# Patient Record
Sex: Male | Born: 1965 | Race: White | Hispanic: No
Health system: Southern US, Community
[De-identification: ages and names within clinical notes are randomized; demographics above are authoritative.]

---

## 1999-07-18 ENCOUNTER — Encounter: Payer: Self-pay | Admitting: Neurological Surgery

## 1999-07-18 ENCOUNTER — Inpatient Hospital Stay (HOSPITAL_COMMUNITY): Admission: AD | Admit: 1999-07-18 | Discharge: 1999-07-19 | Payer: Self-pay | Admitting: Neurological Surgery

## 2002-03-29 ENCOUNTER — Encounter: Payer: Self-pay | Admitting: Emergency Medicine

## 2002-03-29 ENCOUNTER — Encounter: Payer: Self-pay | Admitting: Neurosurgery

## 2002-03-29 ENCOUNTER — Inpatient Hospital Stay (HOSPITAL_COMMUNITY): Admission: EM | Admit: 2002-03-29 | Discharge: 2002-03-31 | Payer: Self-pay | Admitting: Emergency Medicine

## 2002-03-30 ENCOUNTER — Encounter: Payer: Self-pay | Admitting: Emergency Medicine

## 2002-03-30 ENCOUNTER — Encounter: Payer: Self-pay | Admitting: Neurological Surgery

## 2002-07-09 ENCOUNTER — Ambulatory Visit (HOSPITAL_COMMUNITY): Admission: RE | Admit: 2002-07-09 | Discharge: 2002-07-09 | Payer: Self-pay | Admitting: Neurological Surgery

## 2002-07-09 ENCOUNTER — Encounter: Payer: Self-pay | Admitting: Neurological Surgery

## 2003-04-05 ENCOUNTER — Ambulatory Visit (HOSPITAL_COMMUNITY): Admission: RE | Admit: 2003-04-05 | Discharge: 2003-04-05 | Payer: Self-pay | Admitting: Neurological Surgery

## 2003-04-05 ENCOUNTER — Encounter: Payer: Self-pay | Admitting: Neurological Surgery

## 2012-02-05 DIAGNOSIS — IMO0002 Reserved for concepts with insufficient information to code with codable children: Secondary | ICD-10-CM | POA: Diagnosis not present

## 2012-02-21 DIAGNOSIS — M47817 Spondylosis without myelopathy or radiculopathy, lumbosacral region: Secondary | ICD-10-CM | POA: Diagnosis not present

## 2012-03-06 DIAGNOSIS — M47817 Spondylosis without myelopathy or radiculopathy, lumbosacral region: Secondary | ICD-10-CM | POA: Diagnosis not present

## 2012-03-27 DIAGNOSIS — M47817 Spondylosis without myelopathy or radiculopathy, lumbosacral region: Secondary | ICD-10-CM | POA: Diagnosis not present

## 2012-04-20 DIAGNOSIS — R918 Other nonspecific abnormal finding of lung field: Secondary | ICD-10-CM | POA: Diagnosis not present

## 2012-04-20 DIAGNOSIS — R0789 Other chest pain: Secondary | ICD-10-CM | POA: Diagnosis not present

## 2012-04-20 DIAGNOSIS — F17200 Nicotine dependence, unspecified, uncomplicated: Secondary | ICD-10-CM | POA: Diagnosis not present

## 2012-04-20 DIAGNOSIS — R509 Fever, unspecified: Secondary | ICD-10-CM | POA: Diagnosis not present

## 2012-04-20 DIAGNOSIS — R0602 Shortness of breath: Secondary | ICD-10-CM | POA: Diagnosis not present

## 2012-05-01 DIAGNOSIS — M545 Low back pain, unspecified: Secondary | ICD-10-CM | POA: Diagnosis not present

## 2012-05-01 DIAGNOSIS — M5137 Other intervertebral disc degeneration, lumbosacral region: Secondary | ICD-10-CM | POA: Diagnosis not present

## 2012-05-09 DIAGNOSIS — M5137 Other intervertebral disc degeneration, lumbosacral region: Secondary | ICD-10-CM | POA: Diagnosis not present

## 2012-08-04 DIAGNOSIS — M545 Low back pain, unspecified: Secondary | ICD-10-CM | POA: Diagnosis not present

## 2012-10-21 DIAGNOSIS — M545 Low back pain, unspecified: Secondary | ICD-10-CM | POA: Diagnosis not present

## 2012-10-21 DIAGNOSIS — IMO0002 Reserved for concepts with insufficient information to code with codable children: Secondary | ICD-10-CM | POA: Diagnosis not present

## 2012-10-21 DIAGNOSIS — M48061 Spinal stenosis, lumbar region without neurogenic claudication: Secondary | ICD-10-CM | POA: Diagnosis not present

## 2013-01-12 DIAGNOSIS — M5137 Other intervertebral disc degeneration, lumbosacral region: Secondary | ICD-10-CM | POA: Diagnosis not present

## 2013-01-12 DIAGNOSIS — IMO0002 Reserved for concepts with insufficient information to code with codable children: Secondary | ICD-10-CM | POA: Diagnosis not present

## 2013-01-12 DIAGNOSIS — M545 Low back pain: Secondary | ICD-10-CM | POA: Diagnosis not present

## 2013-01-12 DIAGNOSIS — M48061 Spinal stenosis, lumbar region without neurogenic claudication: Secondary | ICD-10-CM | POA: Diagnosis not present

## 2013-01-12 DIAGNOSIS — Z006 Encounter for examination for normal comparison and control in clinical research program: Secondary | ICD-10-CM | POA: Diagnosis not present

## 2013-01-16 ENCOUNTER — Other Ambulatory Visit: Payer: Self-pay | Admitting: Neurosurgery

## 2013-01-16 DIAGNOSIS — M549 Dorsalgia, unspecified: Secondary | ICD-10-CM

## 2013-01-21 DIAGNOSIS — IMO0002 Reserved for concepts with insufficient information to code with codable children: Secondary | ICD-10-CM | POA: Diagnosis not present

## 2013-01-21 DIAGNOSIS — M25569 Pain in unspecified knee: Secondary | ICD-10-CM | POA: Diagnosis not present

## 2013-01-26 ENCOUNTER — Other Ambulatory Visit: Payer: Self-pay

## 2013-01-26 ENCOUNTER — Inpatient Hospital Stay
Admission: RE | Admit: 2013-01-26 | Discharge: 2013-01-26 | Disposition: A | Discharge status: Home / Self Care | Payer: Self-pay | Source: Ambulatory Visit | Attending: Neurosurgery | Admitting: Neurosurgery

## 2013-02-26 DIAGNOSIS — M1710 Unilateral primary osteoarthritis, unspecified knee: Secondary | ICD-10-CM | POA: Diagnosis not present

## 2013-02-26 DIAGNOSIS — M25569 Pain in unspecified knee: Secondary | ICD-10-CM | POA: Diagnosis not present

## 2013-02-26 DIAGNOSIS — IMO0002 Reserved for concepts with insufficient information to code with codable children: Secondary | ICD-10-CM | POA: Diagnosis not present

## 2013-03-04 ENCOUNTER — Other Ambulatory Visit: Payer: Self-pay | Admitting: Orthopedic Surgery

## 2013-03-04 ENCOUNTER — Other Ambulatory Visit: Payer: Self-pay | Admitting: Gastroenterology

## 2013-03-04 DIAGNOSIS — M25561 Pain in right knee: Secondary | ICD-10-CM

## 2013-03-10 ENCOUNTER — Other Ambulatory Visit: Payer: Self-pay

## 2013-03-10 ENCOUNTER — Inpatient Hospital Stay
Admission: RE | Admit: 2013-03-10 | Discharge: 2013-03-10 | Disposition: A | Discharge status: Home / Self Care | Payer: Self-pay | Source: Ambulatory Visit | Attending: Neurosurgery | Admitting: Neurosurgery

## 2013-04-02 ENCOUNTER — Ambulatory Visit
Admission: RE | Admit: 2013-04-02 | Discharge: 2013-04-02 | Disposition: A | Discharge status: Home / Self Care | Payer: Self-pay | Source: Ambulatory Visit | Attending: Neurosurgery | Admitting: Neurosurgery

## 2013-04-02 ENCOUNTER — Ambulatory Visit
Admission: RE | Admit: 2013-04-02 | Discharge: 2013-04-02 | Disposition: A | Discharge status: Home / Self Care | Payer: Self-pay | Source: Ambulatory Visit | Attending: Orthopedic Surgery | Admitting: Orthopedic Surgery

## 2013-04-02 VITALS — BP 118/76 | HR 88

## 2013-04-02 DIAGNOSIS — M549 Dorsalgia, unspecified: Secondary | ICD-10-CM

## 2013-04-02 DIAGNOSIS — M25561 Pain in right knee: Secondary | ICD-10-CM

## 2013-04-02 MED ORDER — MEPERIDINE HCL 100 MG/ML IJ SOLN
75.0000 mg | Freq: Once | INTRAMUSCULAR | Status: AC
  Administered 2013-04-02: 75 mg via INTRAMUSCULAR

## 2013-04-02 MED ORDER — DIAZEPAM 5 MG PO TABS
10.0000 mg | ORAL_TABLET | Freq: Once | ORAL | Status: AC
  Administered 2013-04-02: 10 mg via ORAL

## 2013-04-02 MED ORDER — ONDANSETRON HCL 4 MG/2ML IJ SOLN
4.0000 mg | Freq: Once | INTRAMUSCULAR | Status: AC
  Administered 2013-04-02: 4 mg via INTRAMUSCULAR

## 2013-04-02 MED ORDER — IOHEXOL 180 MG/ML  SOLN
20.0000 mL | Freq: Once | INTRAMUSCULAR | Status: AC | PRN
  Administered 2013-04-02: 20 mL via EPIDURAL

## 2013-04-03 NOTE — Progress Notes (Signed)
Patient's girlfriend, Lizbeth Bark, called to report patient is having severe low back pain after lumbar myelogram yesterday and 24-hour bedrest.  She states he is taking Advil but is out of his prescription pain medication and can't get that refilled until the first of the month (or sometime next week).  Stated patient has been taking extra pain medication recently since his pain has become "unbearable."  Asked if we could prescribe something for his pain; explained to her that we do not do that.  Suggested she call Dr. Butch Penny, who sent patient here for myelogram, to see if she would prescribe extra narcotics.  jkl

## 2013-04-13 DIAGNOSIS — M1710 Unilateral primary osteoarthritis, unspecified knee: Secondary | ICD-10-CM | POA: Diagnosis not present

## 2013-04-15 DIAGNOSIS — M48061 Spinal stenosis, lumbar region without neurogenic claudication: Secondary | ICD-10-CM | POA: Diagnosis not present

## 2013-04-30 DIAGNOSIS — M1710 Unilateral primary osteoarthritis, unspecified knee: Secondary | ICD-10-CM | POA: Diagnosis not present

## 2013-05-07 DIAGNOSIS — M1710 Unilateral primary osteoarthritis, unspecified knee: Secondary | ICD-10-CM | POA: Diagnosis not present

## 2013-07-06 DIAGNOSIS — M48061 Spinal stenosis, lumbar region without neurogenic claudication: Secondary | ICD-10-CM | POA: Diagnosis not present

## 2013-07-06 DIAGNOSIS — IMO0002 Reserved for concepts with insufficient information to code with codable children: Secondary | ICD-10-CM | POA: Diagnosis not present

## 2013-08-04 DIAGNOSIS — IMO0002 Reserved for concepts with insufficient information to code with codable children: Secondary | ICD-10-CM | POA: Diagnosis not present

## 2013-08-04 DIAGNOSIS — M48061 Spinal stenosis, lumbar region without neurogenic claudication: Secondary | ICD-10-CM | POA: Diagnosis not present

## 2013-10-29 DIAGNOSIS — IMO0002 Reserved for concepts with insufficient information to code with codable children: Secondary | ICD-10-CM | POA: Diagnosis not present

## 2013-10-29 DIAGNOSIS — E669 Obesity, unspecified: Secondary | ICD-10-CM | POA: Diagnosis not present

## 2013-10-29 DIAGNOSIS — I10 Essential (primary) hypertension: Secondary | ICD-10-CM | POA: Diagnosis not present

## 2013-10-29 DIAGNOSIS — M48061 Spinal stenosis, lumbar region without neurogenic claudication: Secondary | ICD-10-CM | POA: Diagnosis not present

## 2014-01-25 DIAGNOSIS — IMO0002 Reserved for concepts with insufficient information to code with codable children: Secondary | ICD-10-CM | POA: Diagnosis not present

## 2014-01-25 DIAGNOSIS — M48061 Spinal stenosis, lumbar region without neurogenic claudication: Secondary | ICD-10-CM | POA: Diagnosis not present

## 2014-02-10 DIAGNOSIS — M1990 Unspecified osteoarthritis, unspecified site: Secondary | ICD-10-CM | POA: Diagnosis not present

## 2014-02-10 DIAGNOSIS — M25569 Pain in unspecified knee: Secondary | ICD-10-CM | POA: Diagnosis not present

## 2014-04-20 DIAGNOSIS — Z6829 Body mass index (BMI) 29.0-29.9, adult: Secondary | ICD-10-CM | POA: Diagnosis not present

## 2014-04-20 DIAGNOSIS — M48061 Spinal stenosis, lumbar region without neurogenic claudication: Secondary | ICD-10-CM | POA: Diagnosis not present

## 2014-04-20 DIAGNOSIS — IMO0002 Reserved for concepts with insufficient information to code with codable children: Secondary | ICD-10-CM | POA: Diagnosis not present

## 2014-04-20 DIAGNOSIS — R030 Elevated blood-pressure reading, without diagnosis of hypertension: Secondary | ICD-10-CM | POA: Diagnosis not present

## 2014-06-03 DIAGNOSIS — M25569 Pain in unspecified knee: Secondary | ICD-10-CM | POA: Diagnosis not present

## 2014-06-03 DIAGNOSIS — M1710 Unilateral primary osteoarthritis, unspecified knee: Secondary | ICD-10-CM | POA: Diagnosis not present

## 2014-06-03 DIAGNOSIS — IMO0002 Reserved for concepts with insufficient information to code with codable children: Secondary | ICD-10-CM | POA: Diagnosis not present

## 2014-06-08 DIAGNOSIS — R079 Chest pain, unspecified: Secondary | ICD-10-CM | POA: Diagnosis not present

## 2014-06-08 DIAGNOSIS — R0789 Other chest pain: Secondary | ICD-10-CM | POA: Diagnosis not present

## 2014-06-08 DIAGNOSIS — I10 Essential (primary) hypertension: Secondary | ICD-10-CM | POA: Diagnosis not present

## 2014-06-08 DIAGNOSIS — R059 Cough, unspecified: Secondary | ICD-10-CM | POA: Diagnosis not present

## 2014-06-08 DIAGNOSIS — R05 Cough: Secondary | ICD-10-CM | POA: Diagnosis not present

## 2014-06-09 DIAGNOSIS — I10 Essential (primary) hypertension: Secondary | ICD-10-CM | POA: Diagnosis not present

## 2014-06-09 DIAGNOSIS — J449 Chronic obstructive pulmonary disease, unspecified: Secondary | ICD-10-CM | POA: Diagnosis not present

## 2014-06-09 DIAGNOSIS — R0789 Other chest pain: Secondary | ICD-10-CM | POA: Diagnosis not present

## 2014-06-09 DIAGNOSIS — R0602 Shortness of breath: Secondary | ICD-10-CM | POA: Diagnosis not present

## 2014-06-16 DIAGNOSIS — R0789 Other chest pain: Secondary | ICD-10-CM | POA: Diagnosis not present

## 2014-07-15 DIAGNOSIS — M5416 Radiculopathy, lumbar region: Secondary | ICD-10-CM | POA: Diagnosis not present

## 2014-07-15 DIAGNOSIS — M545 Low back pain: Secondary | ICD-10-CM | POA: Diagnosis not present

## 2014-07-15 DIAGNOSIS — I10 Essential (primary) hypertension: Secondary | ICD-10-CM | POA: Diagnosis not present

## 2014-07-15 DIAGNOSIS — M25561 Pain in right knee: Secondary | ICD-10-CM | POA: Diagnosis not present

## 2014-07-15 DIAGNOSIS — Z6829 Body mass index (BMI) 29.0-29.9, adult: Secondary | ICD-10-CM | POA: Diagnosis not present

## 2014-10-11 DIAGNOSIS — M545 Low back pain: Secondary | ICD-10-CM | POA: Diagnosis not present

## 2014-10-11 DIAGNOSIS — Z6829 Body mass index (BMI) 29.0-29.9, adult: Secondary | ICD-10-CM | POA: Diagnosis not present

## 2014-10-11 DIAGNOSIS — M5416 Radiculopathy, lumbar region: Secondary | ICD-10-CM | POA: Diagnosis not present

## 2014-10-11 DIAGNOSIS — I10 Essential (primary) hypertension: Secondary | ICD-10-CM | POA: Diagnosis not present

## 2014-10-28 DIAGNOSIS — M1711 Unilateral primary osteoarthritis, right knee: Secondary | ICD-10-CM | POA: Diagnosis not present

## 2014-11-11 DIAGNOSIS — M1711 Unilateral primary osteoarthritis, right knee: Secondary | ICD-10-CM | POA: Diagnosis not present

## 2014-11-18 DIAGNOSIS — M1711 Unilateral primary osteoarthritis, right knee: Secondary | ICD-10-CM | POA: Diagnosis not present

## 2015-01-10 DIAGNOSIS — M25561 Pain in right knee: Secondary | ICD-10-CM | POA: Diagnosis not present

## 2015-01-10 DIAGNOSIS — M1711 Unilateral primary osteoarthritis, right knee: Secondary | ICD-10-CM | POA: Diagnosis not present

## 2015-01-13 DIAGNOSIS — M5416 Radiculopathy, lumbar region: Secondary | ICD-10-CM | POA: Diagnosis not present

## 2015-01-13 DIAGNOSIS — M25561 Pain in right knee: Secondary | ICD-10-CM | POA: Diagnosis not present

## 2015-01-13 DIAGNOSIS — Z6828 Body mass index (BMI) 28.0-28.9, adult: Secondary | ICD-10-CM | POA: Diagnosis not present

## 2015-01-13 DIAGNOSIS — I10 Essential (primary) hypertension: Secondary | ICD-10-CM | POA: Diagnosis not present

## 2015-01-13 DIAGNOSIS — M545 Low back pain: Secondary | ICD-10-CM | POA: Diagnosis not present

## 2015-02-03 DIAGNOSIS — M5416 Radiculopathy, lumbar region: Secondary | ICD-10-CM | POA: Diagnosis not present

## 2015-02-03 DIAGNOSIS — M545 Low back pain: Secondary | ICD-10-CM | POA: Diagnosis not present

## 2015-03-03 DIAGNOSIS — M545 Low back pain: Secondary | ICD-10-CM | POA: Diagnosis not present

## 2015-03-03 DIAGNOSIS — Z6827 Body mass index (BMI) 27.0-27.9, adult: Secondary | ICD-10-CM | POA: Diagnosis not present

## 2015-03-03 DIAGNOSIS — M5416 Radiculopathy, lumbar region: Secondary | ICD-10-CM | POA: Diagnosis not present

## 2015-03-03 DIAGNOSIS — I10 Essential (primary) hypertension: Secondary | ICD-10-CM | POA: Diagnosis not present

## 2015-03-03 DIAGNOSIS — M25561 Pain in right knee: Secondary | ICD-10-CM | POA: Diagnosis not present

## 2015-05-02 IMAGING — CT CT L SPINE W/ CM
4 of 11 series · 11 of 33 positions shown, 13 images · non-contrast
Comparison: None.

CLINICAL DATA: Low back pain

CT MYELOGRAPHY LUMBAR SPINE
TECHNIQUE: CT imaging of the lumbar spine was performed after
intrathecal contrast administration.  Multiplanar CT image
reconstructions were also generated.

[Series 2: l spine bone · axial · 0.27mm/px · z∈[-346,-66]mm · 3 of 113 slices shown, 4 images]
[im 1/113  soft-tissue]
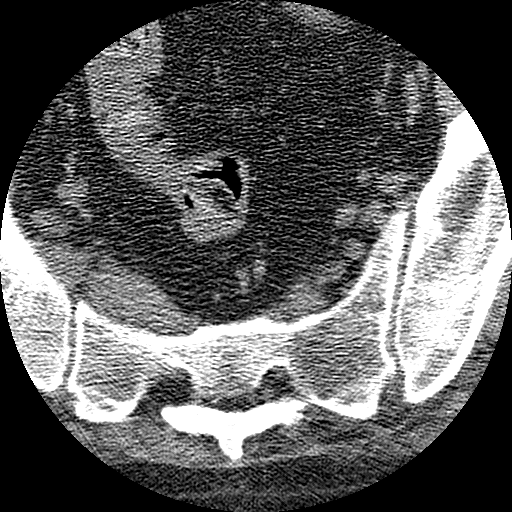
[im 1/113  bone]
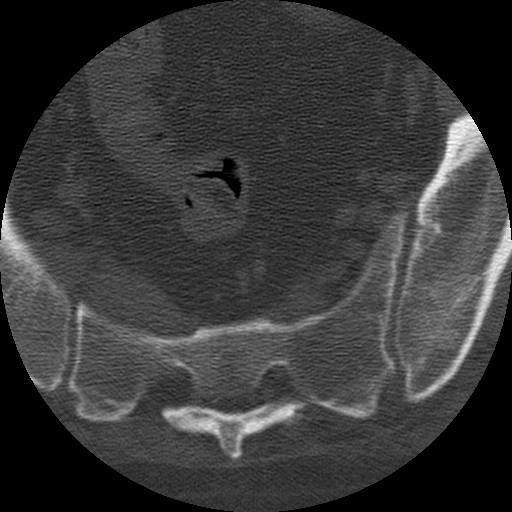
[im 57/113  bone]
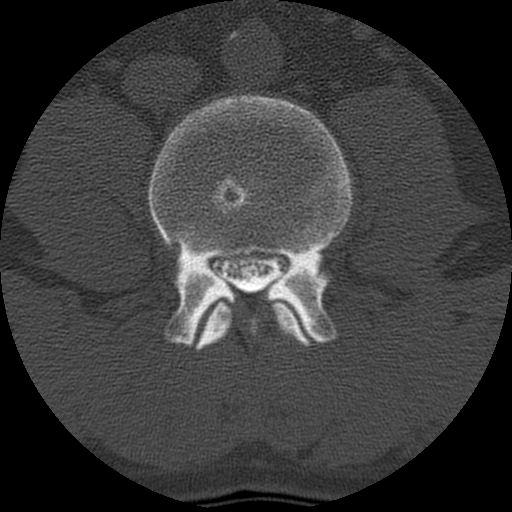
[im 113/113  bone]
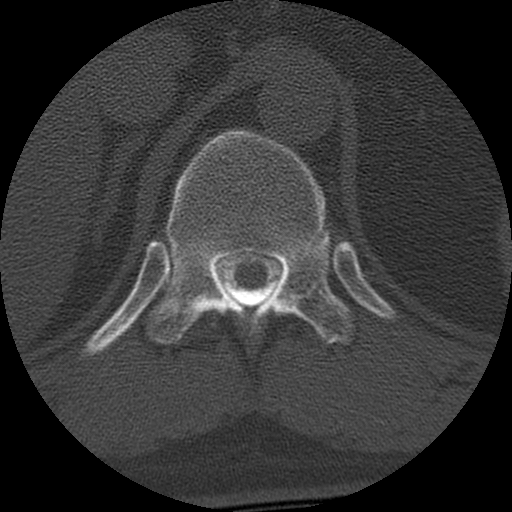

[Series 3: l spine soft · axial · 0.27mm/px · z∈[-254,-161]mm · 2 of 113 slices shown]
[im 38/113  soft-tissue]
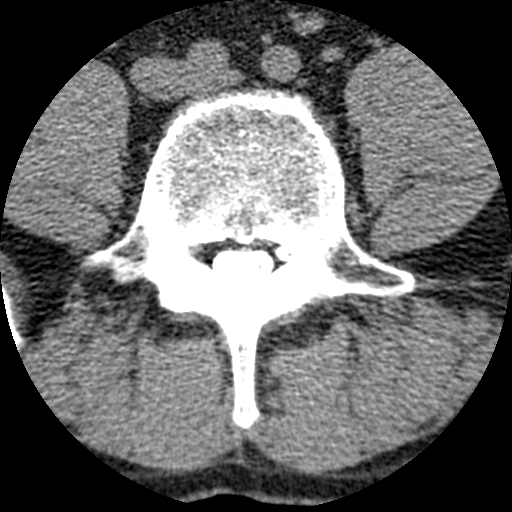
[im 75/113  soft-tissue]
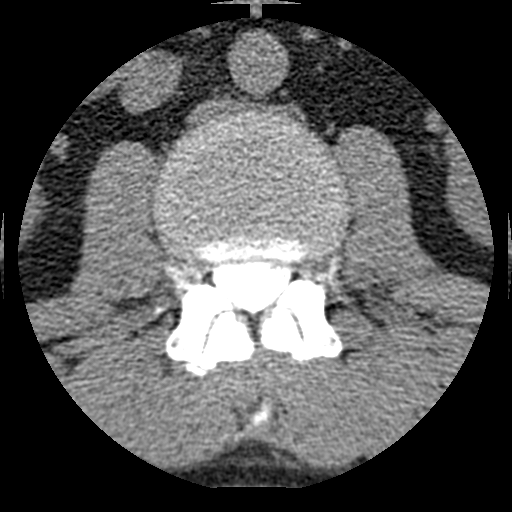

[Series 401: cor lower · coronal · 0.56mm/px · 1 of 59 slices shown]
[im 30/59  bone]
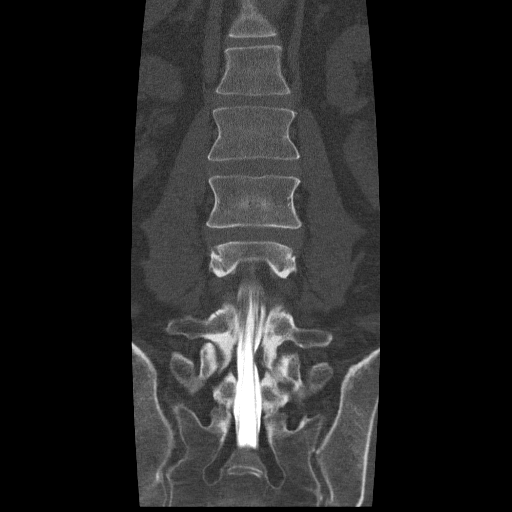

[Series 402: sag · sagittal · 0.56mm/px · 5 of 59 slices shown, 6 images]
[im 20/59  bone]
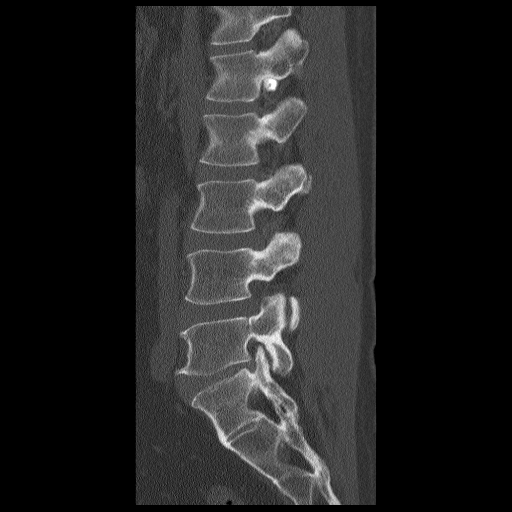
[im 25/59  bone]
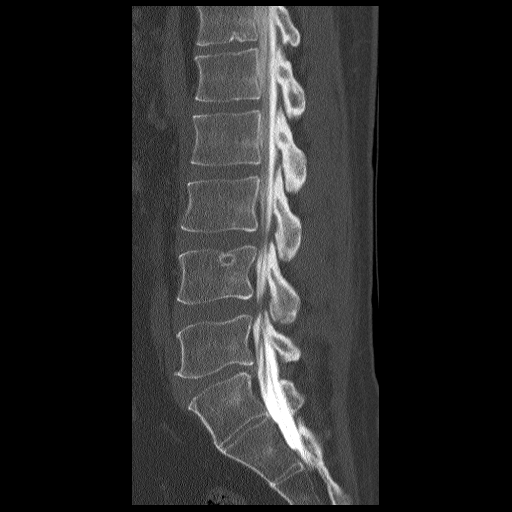
[im 30/59  soft-tissue]
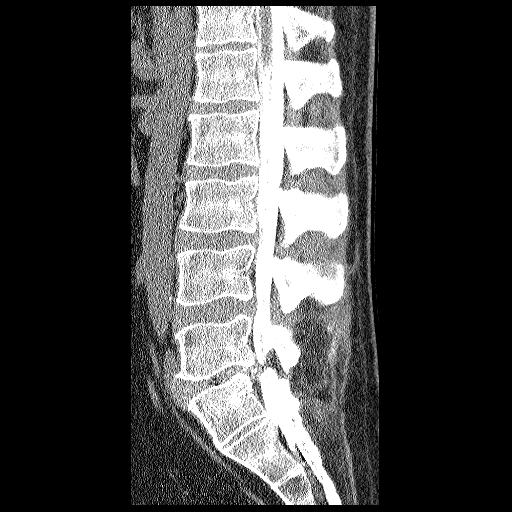
[im 30/59  bone]
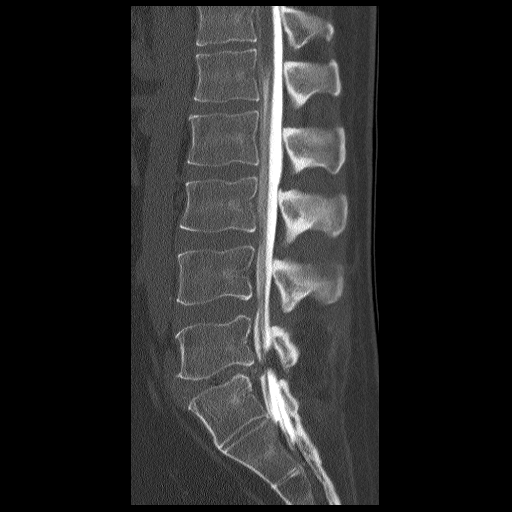
[im 34/59  bone]
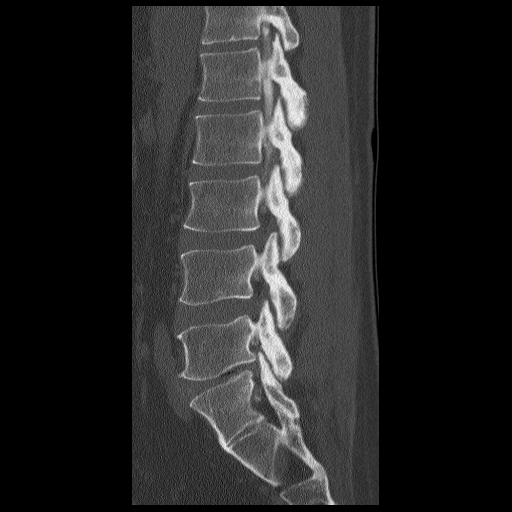
[im 39/59  bone]
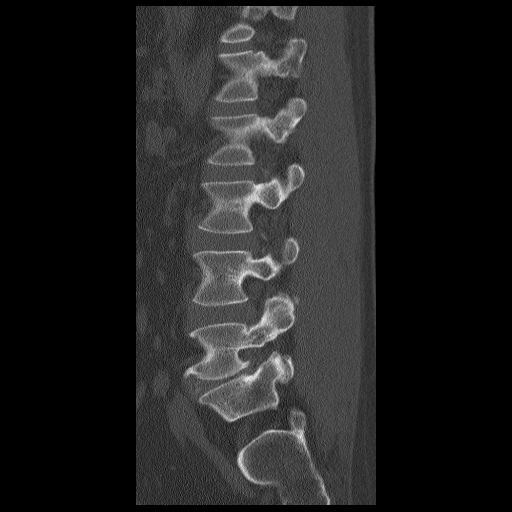

[11 of 33 positions shown; findings below may reference images not displayed]

FINDINGS: Radiologist injection.

Anatomic alignment.  No vertebral compression deformity.  No pars
defect.  Conus medullaris terminates at the L1-2 disc.

L1-2:  Dilated right foraminal nerve root sleeve.  Otherwise
unremarkable

L2-3:  Mild facet arthropathy.  Unremarkable disc and central
canal.

L3-4:  Mild concentric bulge and short pedicles result in mild
central stenosis.  Patent foramina.

L4-5:  Moderate concentric disc bulge asymmetric to the left
posterolateral and foraminal locations.  This results in mild
central stenosis, bilateral lateral recess narrowing left greater
than right, and mild left foraminal narrowing.  Short pedicles
contribute.

L5-S1:  Posterior disc osteophytes asymmetric to the left
posterolateral and foraminal locations are present.  There is also
mild anterior facet overgrowth on the left. This causes moderate
left lateral recess narrowing.  There is probably also extruded
disc fragment in the left foramen containing gas and disc material.
Mild bilateral facet arthropathy left greater than right.

S1, S2:  Rudimentary disc.  No impingement.  Widely patent central
canal and foramina.
IMPRESSION: Mild central stenosis at L3-4.

Mild central stenosis, bilateral lateral recess narrowing, and left
foraminal narrowing at L4-5.

There is probably a left foraminal disc extrusion at L5 S1 as
described above.  There is adjacent moderate left lateral recess
narrowing.

## 2015-05-09 DIAGNOSIS — I10 Essential (primary) hypertension: Secondary | ICD-10-CM | POA: Diagnosis not present

## 2015-05-09 DIAGNOSIS — Z6827 Body mass index (BMI) 27.0-27.9, adult: Secondary | ICD-10-CM | POA: Diagnosis not present

## 2015-05-09 DIAGNOSIS — M545 Low back pain: Secondary | ICD-10-CM | POA: Diagnosis not present

## 2015-05-09 DIAGNOSIS — M25561 Pain in right knee: Secondary | ICD-10-CM | POA: Diagnosis not present

## 2015-05-09 DIAGNOSIS — M5416 Radiculopathy, lumbar region: Secondary | ICD-10-CM | POA: Diagnosis not present

## 2015-07-13 DIAGNOSIS — J449 Chronic obstructive pulmonary disease, unspecified: Secondary | ICD-10-CM | POA: Diagnosis not present

## 2015-07-13 DIAGNOSIS — J181 Lobar pneumonia, unspecified organism: Secondary | ICD-10-CM | POA: Diagnosis not present

## 2015-07-18 DIAGNOSIS — M1711 Unilateral primary osteoarthritis, right knee: Secondary | ICD-10-CM | POA: Diagnosis not present

## 2015-08-29 DIAGNOSIS — M1711 Unilateral primary osteoarthritis, right knee: Secondary | ICD-10-CM | POA: Diagnosis not present

## 2015-09-06 DIAGNOSIS — Z6828 Body mass index (BMI) 28.0-28.9, adult: Secondary | ICD-10-CM | POA: Diagnosis not present

## 2015-09-06 DIAGNOSIS — M25561 Pain in right knee: Secondary | ICD-10-CM | POA: Diagnosis not present

## 2015-09-06 DIAGNOSIS — M545 Low back pain: Secondary | ICD-10-CM | POA: Diagnosis not present

## 2015-09-06 DIAGNOSIS — M5416 Radiculopathy, lumbar region: Secondary | ICD-10-CM | POA: Diagnosis not present

## 2015-11-24 DIAGNOSIS — M5416 Radiculopathy, lumbar region: Secondary | ICD-10-CM | POA: Diagnosis not present

## 2015-11-24 DIAGNOSIS — M545 Low back pain: Secondary | ICD-10-CM | POA: Diagnosis not present

## 2015-11-24 DIAGNOSIS — Z6829 Body mass index (BMI) 29.0-29.9, adult: Secondary | ICD-10-CM | POA: Diagnosis not present

## 2015-11-24 DIAGNOSIS — I10 Essential (primary) hypertension: Secondary | ICD-10-CM | POA: Diagnosis not present

## 2015-12-05 DIAGNOSIS — M5416 Radiculopathy, lumbar region: Secondary | ICD-10-CM | POA: Diagnosis not present

## 2015-12-05 DIAGNOSIS — M545 Low back pain: Secondary | ICD-10-CM | POA: Diagnosis not present

## 2016-03-01 DIAGNOSIS — M5416 Radiculopathy, lumbar region: Secondary | ICD-10-CM | POA: Diagnosis not present

## 2016-03-01 DIAGNOSIS — M545 Low back pain: Secondary | ICD-10-CM | POA: Diagnosis not present

## 2016-03-01 DIAGNOSIS — Z6830 Body mass index (BMI) 30.0-30.9, adult: Secondary | ICD-10-CM | POA: Diagnosis not present

## 2016-03-01 DIAGNOSIS — I10 Essential (primary) hypertension: Secondary | ICD-10-CM | POA: Diagnosis not present

## 2016-03-21 DIAGNOSIS — M1711 Unilateral primary osteoarthritis, right knee: Secondary | ICD-10-CM | POA: Diagnosis not present

## 2016-04-06 DIAGNOSIS — Z01818 Encounter for other preprocedural examination: Secondary | ICD-10-CM | POA: Diagnosis not present

## 2016-04-06 DIAGNOSIS — R52 Pain, unspecified: Secondary | ICD-10-CM | POA: Diagnosis not present

## 2016-04-06 DIAGNOSIS — E559 Vitamin D deficiency, unspecified: Secondary | ICD-10-CM | POA: Diagnosis not present

## 2016-04-06 DIAGNOSIS — M79609 Pain in unspecified limb: Secondary | ICD-10-CM | POA: Diagnosis not present

## 2016-04-06 DIAGNOSIS — Z01810 Encounter for preprocedural cardiovascular examination: Secondary | ICD-10-CM | POA: Diagnosis not present

## 2016-04-06 DIAGNOSIS — Z79899 Other long term (current) drug therapy: Secondary | ICD-10-CM | POA: Diagnosis not present

## 2016-04-10 DIAGNOSIS — M545 Low back pain: Secondary | ICD-10-CM | POA: Diagnosis not present

## 2016-04-10 DIAGNOSIS — M5416 Radiculopathy, lumbar region: Secondary | ICD-10-CM | POA: Diagnosis not present

## 2016-04-17 DIAGNOSIS — E559 Vitamin D deficiency, unspecified: Secondary | ICD-10-CM | POA: Diagnosis not present

## 2016-04-17 DIAGNOSIS — I10 Essential (primary) hypertension: Secondary | ICD-10-CM | POA: Diagnosis not present

## 2016-04-17 DIAGNOSIS — M1711 Unilateral primary osteoarthritis, right knee: Secondary | ICD-10-CM | POA: Diagnosis not present

## 2016-04-17 DIAGNOSIS — Z13220 Encounter for screening for lipoid disorders: Secondary | ICD-10-CM | POA: Diagnosis not present

## 2016-05-08 DIAGNOSIS — M545 Low back pain: Secondary | ICD-10-CM | POA: Diagnosis not present

## 2016-05-08 DIAGNOSIS — M25561 Pain in right knee: Secondary | ICD-10-CM | POA: Diagnosis not present

## 2016-05-08 DIAGNOSIS — M5416 Radiculopathy, lumbar region: Secondary | ICD-10-CM | POA: Diagnosis not present

## 2016-05-22 DIAGNOSIS — S338XXA Sprain of other parts of lumbar spine and pelvis, initial encounter: Secondary | ICD-10-CM | POA: Diagnosis not present

## 2016-05-22 DIAGNOSIS — M9903 Segmental and somatic dysfunction of lumbar region: Secondary | ICD-10-CM | POA: Diagnosis not present

## 2016-05-23 DIAGNOSIS — S338XXA Sprain of other parts of lumbar spine and pelvis, initial encounter: Secondary | ICD-10-CM | POA: Diagnosis not present

## 2016-05-23 DIAGNOSIS — M9903 Segmental and somatic dysfunction of lumbar region: Secondary | ICD-10-CM | POA: Diagnosis not present

## 2016-05-25 DIAGNOSIS — M9903 Segmental and somatic dysfunction of lumbar region: Secondary | ICD-10-CM | POA: Diagnosis not present

## 2016-05-25 DIAGNOSIS — S338XXA Sprain of other parts of lumbar spine and pelvis, initial encounter: Secondary | ICD-10-CM | POA: Diagnosis not present

## 2016-05-30 DIAGNOSIS — S338XXA Sprain of other parts of lumbar spine and pelvis, initial encounter: Secondary | ICD-10-CM | POA: Diagnosis not present

## 2016-05-30 DIAGNOSIS — M9903 Segmental and somatic dysfunction of lumbar region: Secondary | ICD-10-CM | POA: Diagnosis not present

## 2016-06-01 DIAGNOSIS — S338XXA Sprain of other parts of lumbar spine and pelvis, initial encounter: Secondary | ICD-10-CM | POA: Diagnosis not present

## 2016-06-01 DIAGNOSIS — M9903 Segmental and somatic dysfunction of lumbar region: Secondary | ICD-10-CM | POA: Diagnosis not present

## 2016-06-06 DIAGNOSIS — M9903 Segmental and somatic dysfunction of lumbar region: Secondary | ICD-10-CM | POA: Diagnosis not present

## 2016-06-06 DIAGNOSIS — S338XXA Sprain of other parts of lumbar spine and pelvis, initial encounter: Secondary | ICD-10-CM | POA: Diagnosis not present

## 2016-06-13 DIAGNOSIS — S338XXA Sprain of other parts of lumbar spine and pelvis, initial encounter: Secondary | ICD-10-CM | POA: Diagnosis not present

## 2016-06-13 DIAGNOSIS — M9903 Segmental and somatic dysfunction of lumbar region: Secondary | ICD-10-CM | POA: Diagnosis not present

## 2016-07-24 DIAGNOSIS — Z6830 Body mass index (BMI) 30.0-30.9, adult: Secondary | ICD-10-CM | POA: Diagnosis not present

## 2016-07-24 DIAGNOSIS — M25561 Pain in right knee: Secondary | ICD-10-CM | POA: Diagnosis not present

## 2016-07-24 DIAGNOSIS — M545 Low back pain: Secondary | ICD-10-CM | POA: Diagnosis not present

## 2016-07-24 DIAGNOSIS — M5416 Radiculopathy, lumbar region: Secondary | ICD-10-CM | POA: Diagnosis not present

## 2016-07-24 DIAGNOSIS — I10 Essential (primary) hypertension: Secondary | ICD-10-CM | POA: Diagnosis not present

## 2016-10-24 DIAGNOSIS — I10 Essential (primary) hypertension: Secondary | ICD-10-CM | POA: Diagnosis not present

## 2016-10-24 DIAGNOSIS — M545 Low back pain: Secondary | ICD-10-CM | POA: Diagnosis not present

## 2016-10-24 DIAGNOSIS — M25561 Pain in right knee: Secondary | ICD-10-CM | POA: Diagnosis not present

## 2016-10-24 DIAGNOSIS — M5416 Radiculopathy, lumbar region: Secondary | ICD-10-CM | POA: Diagnosis not present

## 2016-10-24 DIAGNOSIS — Z6830 Body mass index (BMI) 30.0-30.9, adult: Secondary | ICD-10-CM | POA: Diagnosis not present

## 2017-01-14 DIAGNOSIS — Z79891 Long term (current) use of opiate analgesic: Secondary | ICD-10-CM | POA: Diagnosis not present

## 2017-01-14 DIAGNOSIS — Z79899 Other long term (current) drug therapy: Secondary | ICD-10-CM | POA: Diagnosis not present

## 2017-01-14 DIAGNOSIS — Z5181 Encounter for therapeutic drug level monitoring: Secondary | ICD-10-CM | POA: Diagnosis not present

## 2017-01-14 DIAGNOSIS — M545 Low back pain: Secondary | ICD-10-CM | POA: Diagnosis not present

## 2017-01-14 DIAGNOSIS — M25561 Pain in right knee: Secondary | ICD-10-CM | POA: Diagnosis not present

## 2017-01-14 DIAGNOSIS — I10 Essential (primary) hypertension: Secondary | ICD-10-CM | POA: Diagnosis not present

## 2017-01-14 DIAGNOSIS — M5416 Radiculopathy, lumbar region: Secondary | ICD-10-CM | POA: Diagnosis not present

## 2017-01-14 DIAGNOSIS — Z6830 Body mass index (BMI) 30.0-30.9, adult: Secondary | ICD-10-CM | POA: Diagnosis not present

## 2017-03-04 DIAGNOSIS — M5416 Radiculopathy, lumbar region: Secondary | ICD-10-CM | POA: Diagnosis not present

## 2017-04-01 DIAGNOSIS — M25561 Pain in right knee: Secondary | ICD-10-CM | POA: Diagnosis not present

## 2017-04-01 DIAGNOSIS — R030 Elevated blood-pressure reading, without diagnosis of hypertension: Secondary | ICD-10-CM | POA: Diagnosis not present

## 2017-04-01 DIAGNOSIS — M5416 Radiculopathy, lumbar region: Secondary | ICD-10-CM | POA: Diagnosis not present

## 2017-04-01 DIAGNOSIS — M545 Low back pain: Secondary | ICD-10-CM | POA: Diagnosis not present

## 2017-04-01 DIAGNOSIS — Z6829 Body mass index (BMI) 29.0-29.9, adult: Secondary | ICD-10-CM | POA: Diagnosis not present

## 2017-06-25 DIAGNOSIS — Z6830 Body mass index (BMI) 30.0-30.9, adult: Secondary | ICD-10-CM | POA: Diagnosis not present

## 2017-06-25 DIAGNOSIS — I10 Essential (primary) hypertension: Secondary | ICD-10-CM | POA: Diagnosis not present

## 2017-06-25 DIAGNOSIS — M5416 Radiculopathy, lumbar region: Secondary | ICD-10-CM | POA: Diagnosis not present

## 2017-06-25 DIAGNOSIS — M545 Low back pain: Secondary | ICD-10-CM | POA: Diagnosis not present

## 2017-06-27 DIAGNOSIS — M545 Low back pain: Secondary | ICD-10-CM | POA: Diagnosis not present

## 2017-06-27 DIAGNOSIS — M5416 Radiculopathy, lumbar region: Secondary | ICD-10-CM | POA: Diagnosis not present

## 2017-07-30 DIAGNOSIS — Z6829 Body mass index (BMI) 29.0-29.9, adult: Secondary | ICD-10-CM | POA: Diagnosis not present

## 2017-07-30 DIAGNOSIS — R030 Elevated blood-pressure reading, without diagnosis of hypertension: Secondary | ICD-10-CM | POA: Diagnosis not present

## 2017-10-31 DIAGNOSIS — J019 Acute sinusitis, unspecified: Secondary | ICD-10-CM | POA: Diagnosis not present

## 2017-12-14 DIAGNOSIS — J111 Influenza due to unidentified influenza virus with other respiratory manifestations: Secondary | ICD-10-CM | POA: Diagnosis not present

## 2018-06-26 ENCOUNTER — Other Ambulatory Visit: Payer: Self-pay | Admitting: Neurological Surgery

## 2018-08-06 NOTE — Pre-Procedure Instructions (Signed)
Earl LovelessJesse C Costley  08/06/2018     Your procedure is scheduled on Monday, December 9.  [facility] at [facility]:30 AM                Your surgery or procedure is scheduled for 7:30 AM.   Call this number if you have problems the morning of surgery: [phone]  This is the number for the Pre- Surgical Desk.                For any other questions, please call [phone], Monday - Friday 8 AM - 4 PM.    Remember:  Do not eat or drink after midnight Sunday, December 8.           Take these medicines the morning of surgery with A SIP OF WATER :  If needed : Use albuterol (PROVENTIL HFA;VENTOLIN HFA) inhaler and please bring it to the hospital with you. Oxycodone  Special instructions:   [place]- Preparing For Surgery  Before surgery, you can play an important role. Because skin is not sterile, your skin needs to be as free of germs as possible. You can reduce the number of germs on your skin by washing with CHG (chlorahexidine gluconate) Soap before surgery.  CHG is an antiseptic cleaner which kills germs and bonds with the skin to continue killing germs even after washing.    Oral Hygiene is also important to reduce your risk of infection.  Remember - BRUSH YOUR TEETH THE MORNING OF SURGERY WITH YOUR REGULAR TOOTHPASTE  Please do not use if you have an allergy to CHG or antibacterial soaps. If your skin becomes reddened/irritated stop using the CHG.  Do not shave (including legs and underarms) for at least 48 hours prior to first CHG shower. It is OK to shave your face.  Please follow these instructions carefully.   1. Shower the NIGHT BEFORE SURGERY and the MORNING OF SURGERY with CHG.   2. If you chose to wash your hair, wash your hair first as usual with your normal shampoo.  3. After you shampoo, rinse your hair and body thoroughly to remove the shampoo.  4. Use CHG as you would any other liquid soap. You can apply CHG directly to the skin and  wash gently with a scrungie or a clean washcloth.   5. Apply the CHG Soap to your body ONLY FROM THE NECK DOWN.  Do not use on open wounds or open sores. Avoid contact with your eyes, ears, mouth and genitals (private parts). Wash Face and genitals (private parts)  with your normal soap.  6. Wash thoroughly, paying special attention to the area where your surgery will be performed.  7. Thoroughly rinse your body with warm water from the neck down.  8. DO NOT shower/wash with your normal soap after using and rinsing off the CHG Soap.  9. Pat yourself dry with a CLEAN TOWEL.  10. Wear CLEAN PAJAMAS to bed the night before surgery, wear comfortable clothes the morning of surgery  11. Place CLEAN SHEETS on your bed the night of your first shower and DO NOT SLEEP WITH PETS.    Day of Surgery: Shower as above  Do not apply any deodorants/lotions, powder or colognes.  Please wear clean clothes to the hospital/surgery center.   Remember to brush your teeth WITH YOUR REGULAR TOOTHPASTE.  Do not wear jewelry, make-up or nail polish.  Do not shave 48 hours prior to surgery.  Men may shave face and neck.  Do not bring valuables to the hospital.  [facility] is not responsible for any belongings or valuables.  Contacts, dentures or bridgework may not be worn into surgery.  Leave your suitcase in the car.  After surgery it may be brought to your room.  For patients admitted to the hospital, discharge time will be determined by your treatment team.  Patients discharged the day of surgery will not be allowed to drive home.   Please read over the following fact sheets that you were given. Pain Booklet, Patient Instructions for Mupirocin Application, Coughing and Deep Breathing, Surgical Site Infections.

## 2018-08-06 NOTE — Pre-Procedure Instructions (Signed)
Earl Monroe  08/06/2018     Your procedure is scheduled on Monday, December 9.  Report to [facility] Admitting at 5:30 AM                Your surgery or procedure is scheduled for 7:30 AM.   Call this number if you have problems the morning of surgery: [phone]  This is the number for the Pre- Surgical Desk.                For any other questions, please call [phone], Monday - Friday 8 AM - 4 PM.    Remember:  Do not eat or drink after midnight Sunday, December 8.           Take these medicines the morning of surgery with A SIP OF WATER :  If needed : Use albuterol (PROVENTIL HFA;VENTOLIN HFA) inhaler and please bring it to the hospital with you. Oxycodone  Special instructions:   [place]- Preparing For Surgery  Before surgery, you can play an important role. Because skin is not sterile, your skin needs to be as free of germs as possible. You can reduce the number of germs on your skin by washing with CHG (chlorahexidine gluconate) Soap before surgery.  CHG is an antiseptic cleaner which kills germs and bonds with the skin to continue killing germs even after washing.    Oral Hygiene is also important to reduce your risk of infection.  Remember - BRUSH YOUR TEETH THE MORNING OF SURGERY WITH YOUR REGULAR TOOTHPASTE  Please do not use if you have an allergy to CHG or antibacterial soaps. If your skin becomes reddened/irritated stop using the CHG.  Do not shave (including legs and underarms) for at least 48 hours prior to first CHG shower. It is OK to shave your face.  Please follow these instructions carefully.   1. Shower the NIGHT BEFORE SURGERY and the MORNING OF SURGERY with CHG.   2. If you chose to wash your hair, wash your hair first as usual with your normal shampoo.  3. After you shampoo, Wash your face and private area with the soap you use at home, then rinse your hair and body thoroughly to remove the shampoo and soap.  4. Use CHG as  you would any other liquid soap. You can apply CHG directly to the skin and wash gently with a scrungie or a clean washcloth.   5. Apply the CHG Soap to your body ONLY FROM THE NECK DOWN.  Do not use on open wounds or open sores. Avoid contact with your eyes, ears, mouth and genitals (private parts). Wash Face and genitals (private parts)  with your normal soap.  6. Wash thoroughly, paying special attention to the area where your surgery will be performed.  7. Thoroughly rinse your body with warm water from the neck down.  8. DO NOT shower/wash with your normal soap after using and rinsing off the CHG Soap.  9. Pat yourself dry with a CLEAN TOWEL.  10. Wear CLEAN PAJAMAS to bed the night before surgery, wear comfortable clothes the morning of surgery  11. Place CLEAN SHEETS on your bed the night of your first shower and DO NOT SLEEP WITH PETS.    Day of Surgery: Shower as above  Do not apply any deodorants/lotions, powder or colognes.  Please wear clean clothes to the hospital/surgery center.   Remember to brush your teeth WITH YOUR REGULAR TOOTHPASTE.  Do  not wear jewelry, make-up or nail polish.  Do not shave 48 hours prior to surgery.  Men may shave face and neck.  Do not bring valuables to the hospital.  [facility] is not responsible for any belongings or valuables.  Contacts, dentures or bridgework may not be worn into surgery.  Leave your suitcase in the car.  After surgery it may be brought to your room.  For patients admitted to the hospital, discharge time will be determined by your treatment team.  Patients discharged the day of surgery will not be allowed to drive home.   Please read over the following fact sheets that you were given. Pain Booklet, Patient Instructions for Mupirocin Application, Coughing and Deep Breathing, Surgical Site Infections.

## 2018-08-11 ENCOUNTER — Inpatient Hospital Stay (HOSPITAL_COMMUNITY)
Admission: RE | Admit: 2018-08-11 | Discharge: 2018-08-11 | Disposition: A | Discharge status: Home / Self Care | Payer: Self-pay | Source: Ambulatory Visit

## 2018-08-11 ENCOUNTER — Inpatient Hospital Stay (HOSPITAL_COMMUNITY)
Admission: RE | Admit: 2018-08-11 | Discharge: 2018-08-11 | Disposition: A | Discharge status: Home / Self Care | Payer: Medicare HMO | Source: Ambulatory Visit

## 2018-08-11 NOTE — Pre-Procedure Instructions (Signed)
Earl Monroe  08/11/2018      [id number] - [place], [place] - [address] AT [facility] [address] [facility] [address] [place] [address] Phone: [phone] Fax: [phone]    Your procedure is scheduled on December 9th.  Report to [facility] Admitting at 5:30 A.M.  Call this number if you have problems the morning of surgery:  [phone]   Remember:  Do not eat or drink after midnight.     Take these medicines the morning of surgery with A SIP OF WATER    Oxycodone - if needed  Albuterol Inhaler - if needed   7 days prior to surgery STOP taking any Aspirin(unless otherwise instructed by your surgeon), Aleve, Naproxen, Ibuprofen, Motrin, Advil, Goody's, BC's, all herbal medications, fish oil, and all vitamins   Do not wear jewelry  Do not wear lotions, powders, or colognes, or deodorant.  Do not shave 48 hours prior to surgery.  Men may shave face and neck.  Do not bring valuables to the hospital.  [facility] is not responsible for any belongings or valuables.   [place]- Preparing For Surgery  Before surgery, you can play an important role. Because skin is not sterile, your skin needs to be as free of germs as possible. You can reduce the number of germs on your skin by washing with CHG (chlorahexidine gluconate) Soap before surgery.  CHG is an antiseptic cleaner which kills germs and bonds with the skin to continue killing germs even after washing.    Oral Hygiene is also important to reduce your risk of infection.  Remember - BRUSH YOUR TEETH THE MORNING OF SURGERY WITH YOUR REGULAR TOOTHPASTE  Please do not use if you have an allergy to CHG or antibacterial soaps. If your skin becomes reddened/irritated stop using the CHG.  Do not shave (including legs and underarms) for at least 48 hours prior to first CHG shower. It is OK to shave your face.  Please follow these instructions carefully.   1. Shower the NIGHT  BEFORE SURGERY and the MORNING OF SURGERY with CHG.   2. If you chose to wash your hair, wash your hair first as usual with your normal shampoo.  3. After you shampoo, rinse your hair and body thoroughly to remove the shampoo.  4. Use CHG as you would any other liquid soap. You can apply CHG directly to the skin and wash gently with a scrungie or a clean washcloth.   5. Apply the CHG Soap to your body ONLY FROM THE NECK DOWN.  Do not use on open wounds or open sores. Avoid contact with your eyes, ears, mouth and genitals (private parts). Wash Face and genitals (private parts)  with your normal soap.  6. Wash thoroughly, paying special attention to the area where your surgery will be performed.  7. Thoroughly rinse your body with warm water from the neck down.  8. DO NOT shower/wash with your normal soap after using and rinsing off the CHG Soap.  9. Pat yourself dry with a CLEAN TOWEL.  10. Wear CLEAN PAJAMAS to bed the night before surgery, wear comfortable clothes the morning of surgery  11. Place CLEAN SHEETS on your bed the night of your first shower and DO NOT SLEEP WITH PETS.   Day of Surgery:  Do not apply any deodorants/lotions.  Please wear clean clothes to the hospital/surgery center.   Remember to brush  your teeth WITH YOUR REGULAR TOOTHPASTE.   Contacts, dentures or bridgework may not be worn into surgery.  Leave your suitcase in the car.  After surgery it may be brought to your room.  For patients admitted to the hospital, discharge time will be determined by your treatment team.  Patients discharged the day of surgery will not be allowed to drive home.   Please read over the following fact sheets that you were given. Coughing and Deep Breathing, MRSA Information and Surgical Site Infection Prevention

## 2018-09-17 ENCOUNTER — Inpatient Hospital Stay (HOSPITAL_COMMUNITY)
Admission: RE | Admit: 2018-09-17 | Discharge: 2018-09-17 | Disposition: A | Discharge status: Home / Self Care | Payer: Medicare HMO | Source: Ambulatory Visit

## 2018-09-17 NOTE — Pre-Procedure Instructions (Signed)
Antonieta LovelessJesse C Montroy  09/17/2018      [id number] [place] - [address] [facility] [id number] [place] [id number] Phone: [phone] Fax: [phone]    Your procedure is scheduled on Tuesday, January 14  [facility]trance "A" at 5:30 AM   Call this number if you have problems the morning of surgery: [phone]  This is the number for the Pre- Surgical Desk.                    Your surgery or procedure is scheduled for 7:30 AM   Remember:  Do not eat or drink after midnight Monday, January 13.   Take these medicines the morning of surgery with A SIP OF WATER :  May take if needed : Oxycodone            May use if needed : albuterol (PROVENTIL HFA;VENTOLIN HFA) inhaler- please bring it with you.   Do not wear jewelry, make-up or nail polish.  Do not wear lotions, powders, or perfumes, or deodorant.  Do not shave 48 hours prior to surgery.  Men may shave face and neck.  Do not bring valuables to the hospital.  [facility] is not responsible for any belongings or valuables.  Contacts, dentures or bridgework may not be worn into surgery.  Leave your suitcase in the car.  After surgery it may be brought to your room.  For patients admitted to the hospital, discharge time will be determined by your treatment team.  Patients discharged the day of surgery will not be allowed to drive home.   Special instructions:  [place]- Preparing For Surgery  Before surgery, you can play an important role. Because skin is not sterile, your skin needs to be as free of germs as possible. You can reduce the number of germs on your skin by washing with CHG (chlorahexidine gluconate) Soap before surgery.  CHG is an antiseptic cleaner which kills germs and bonds with the skin to continue killing germs even after washing.    Oral Hygiene is also important to reduce your risk of infection.  Remember -  BRUSH YOUR TEETH THE MORNING OF SURGERY WITH YOUR REGULAR TOOTHPASTE  Please do not use if you have an allergy to CHG or antibacterial soaps. If your skin becomes reddened/irritated stop using the CHG.  Do not shave (including legs and underarms) for at least 48 hours prior to first CHG shower. It is OK to shave your face.  Please follow these instructions carefully.   1. Shower the NIGHT BEFORE SURGERY and the MORNING OF SURGERY with CHG.   2. If you chose to wash your hair, wash your hair first as usual with your normal shampoo.  3. After you shampoo, rinse your hair and body thoroughly to remove the shampoo.  4. Use CHG as you would any other liquid soap. You can apply CHG directly to the skin and wash gently with a scrungie or a clean washcloth.   5. Apply the CHG Soap to your body ONLY FROM THE NECK DOWN.  Do not use on open wounds or open sores. Avoid contact with your eyes, ears, mouth and genitals (private parts). Wash Face and genitals (private parts)  with your normal soap.  6. Wash thoroughly, paying special attention to the area where your surgery will be performed.  7. Thoroughly rinse your  body with warm water from the neck down.  8. DO NOT shower/wash with your normal soap after using and rinsing off the CHG Soap.  9. Pat yourself dry with a CLEAN TOWEL.  10. Wear CLEAN PAJAMAS to bed the night before surgery, wear comfortable clothes the morning of surgery  11. Place CLEAN SHEETS on your bed the night of your first shower and DO NOT SLEEP WITH PETS.  Day of Surgery:  Shower as above  Do not apply any deodorants/lotions.  Please wear clean clothes to the hospital/surgery center.   Remember to brush your teeth WITH YOUR REGULAR TOOTHPASTE.  Please read over the following fact sheets that you were given: Pain Booklet, Patient Instructions for Mupirocin Application, Coughing and Deep Breathing, Surgical Site Infections.

## 2018-09-23 ENCOUNTER — Encounter (HOSPITAL_COMMUNITY): Admission: RE | Payer: Self-pay | Source: Ambulatory Visit

## 2018-09-23 ENCOUNTER — Inpatient Hospital Stay (HOSPITAL_COMMUNITY): Admission: RE | Admit: 2018-09-23 | Payer: Medicare HMO | Source: Ambulatory Visit | Admitting: Neurological Surgery

## 2018-09-23 SURGERY — POSTERIOR LUMBAR FUSION 2 LEVEL
Anesthesia: General | Site: Back
